# Patient Record
Sex: Male | Born: 1995 | Race: White | Hispanic: No | Marital: Single | Smoking: Never smoker
Health system: Southern US, Community
[De-identification: ages and names within clinical notes are randomized; demographics above are authoritative.]

## PROBLEM LIST (undated history)

## (undated) DIAGNOSIS — Z8719 Personal history of other diseases of the digestive system: Secondary | ICD-10-CM

## (undated) DIAGNOSIS — K551 Chronic vascular disorders of intestine: Secondary | ICD-10-CM

## (undated) HISTORY — PX: NO PAST SURGERIES: SHX2092

---
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
## 2014-05-16 IMAGING — CR DG ABDOMEN ACUTE W/ 1V CHEST
3 series · 3 of 3 positions shown · non-contrast
Comparison: Abdominal radiograph 11/21/2011, chest radiograph
11/20/2011

CLINICAL DATA: Nausea, vomiting, history gastric outlet obstruction

ACUTE ABDOMEN SERIES (ABDOMEN 2 VIEW & CHEST 1 VIEW)

[w chest pa]
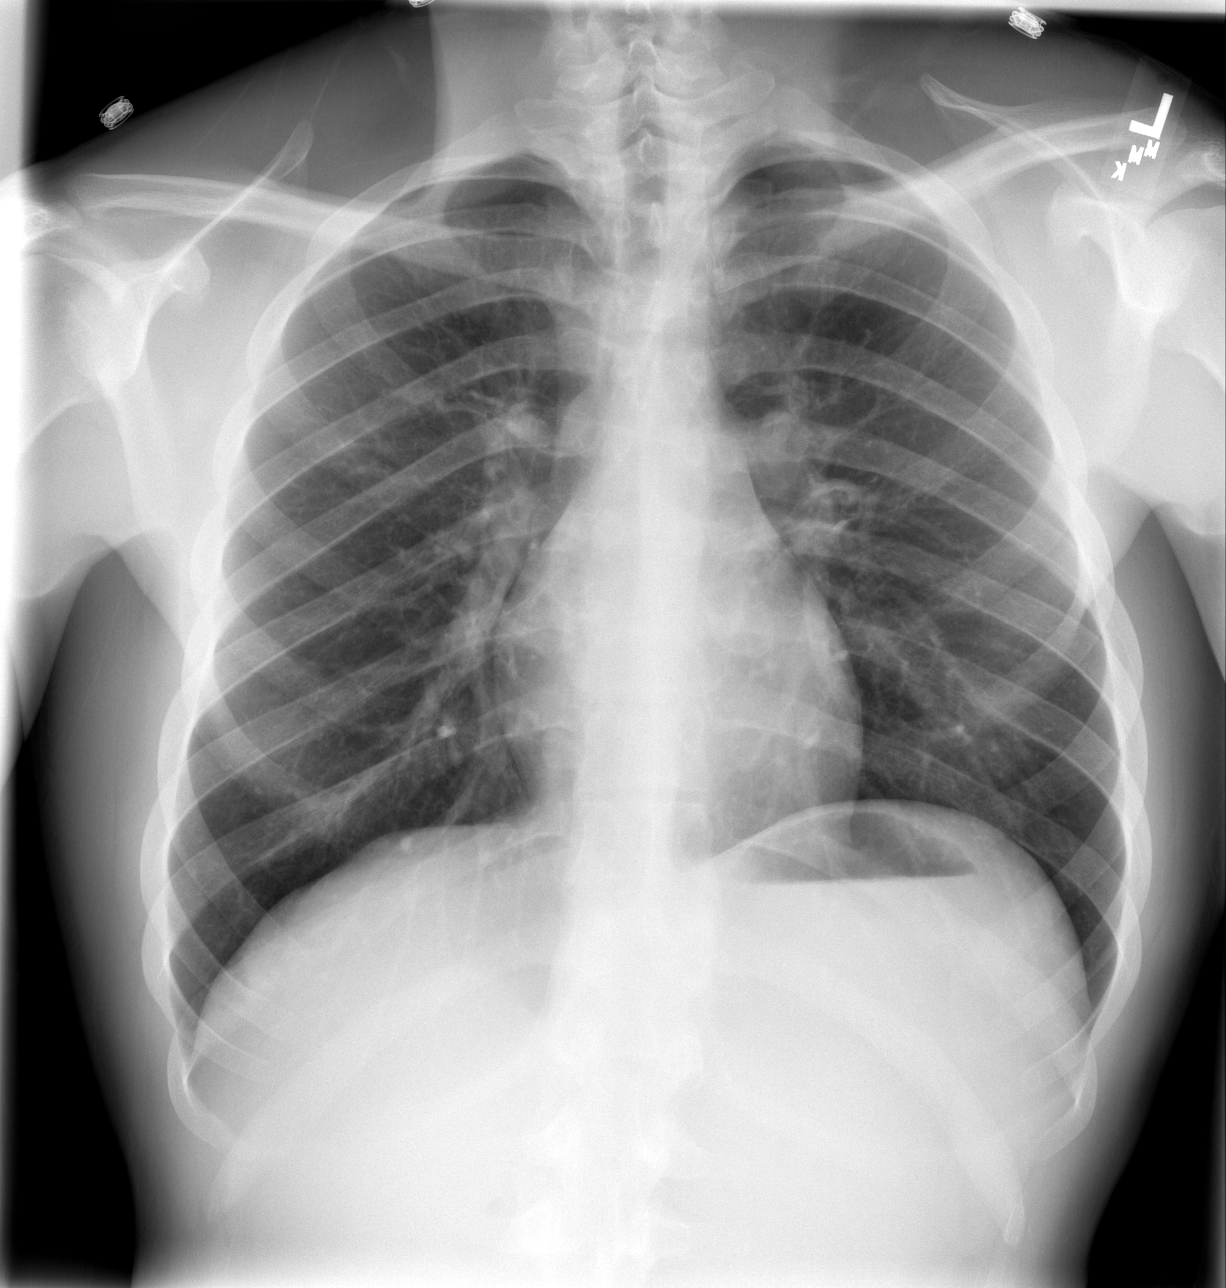

[w abdomen upright]
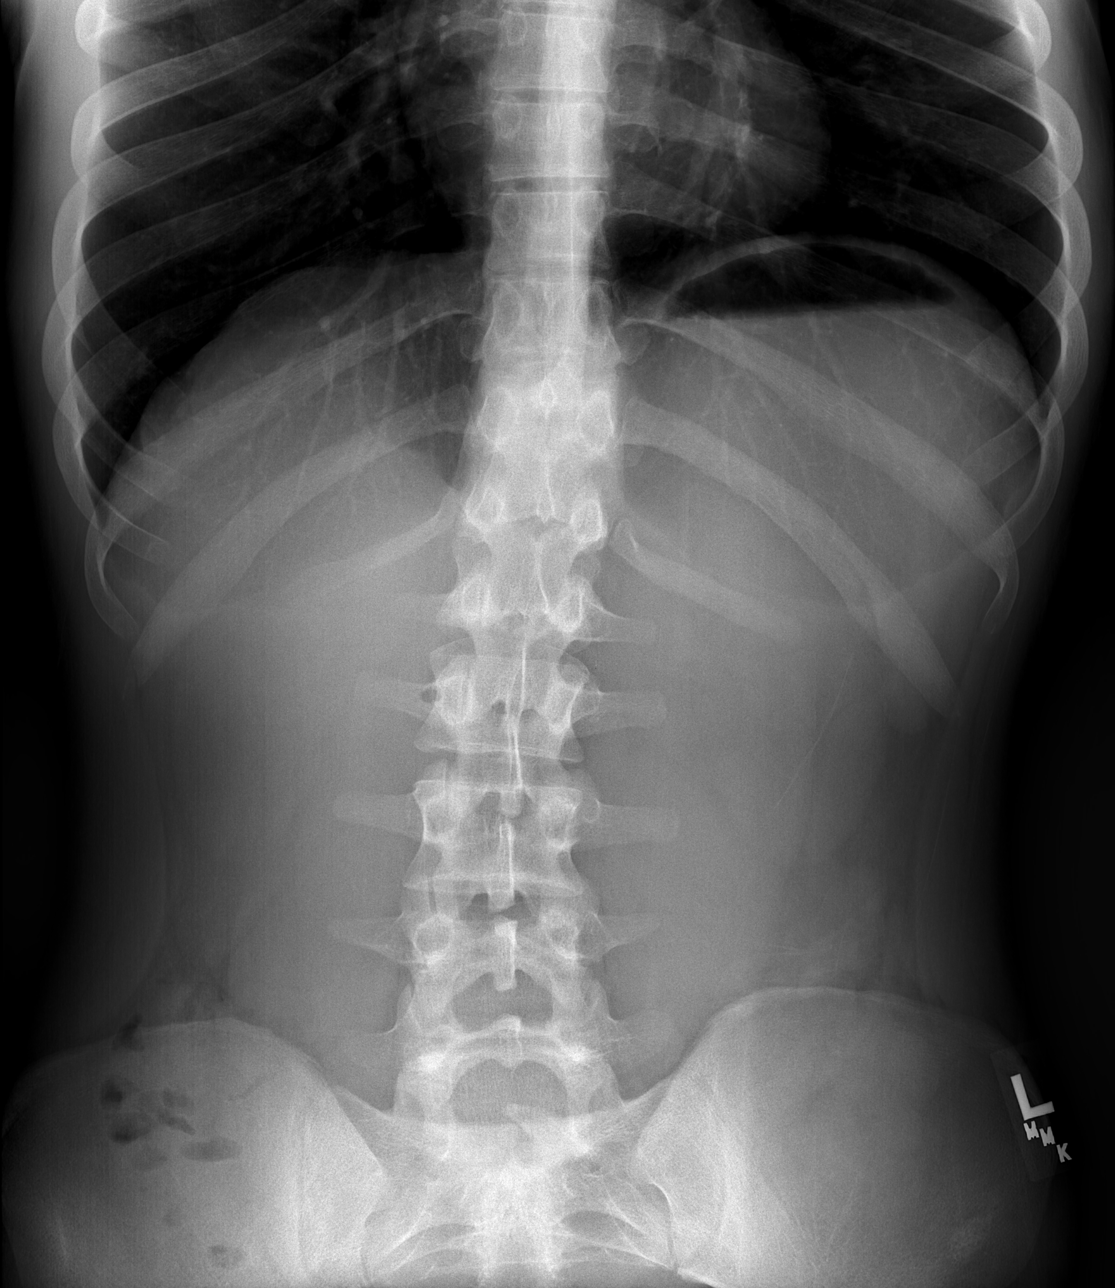

[t abdomen supine]
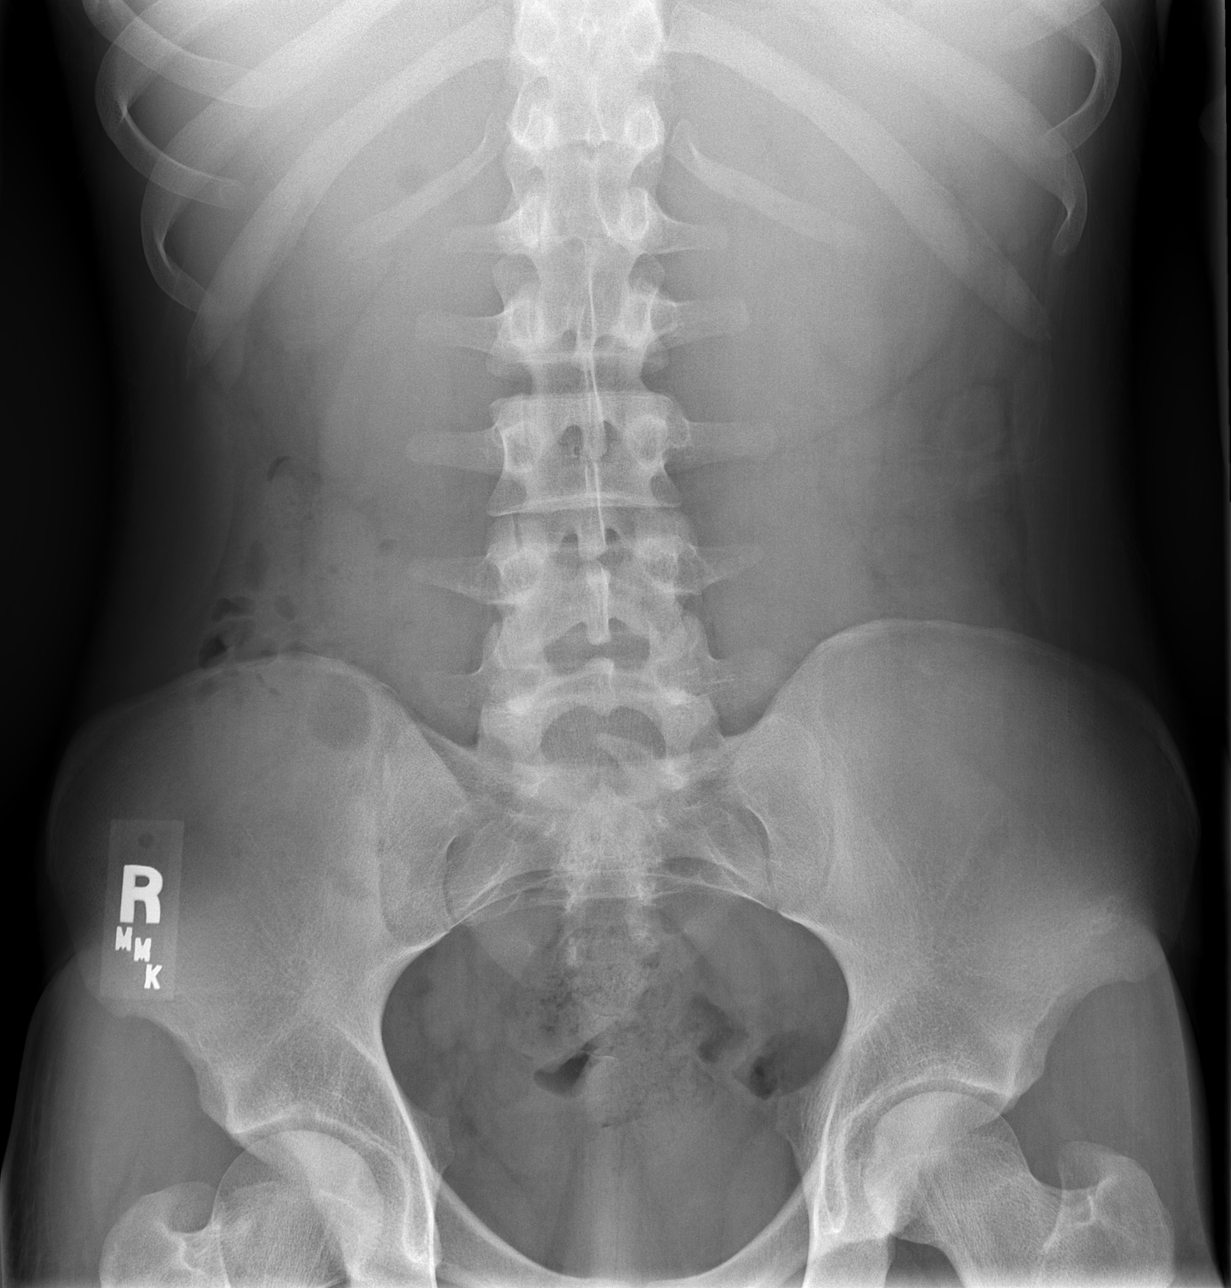

[3 of 3 positions shown; findings below may reference images not displayed]

FINDINGS: Normal heart size, mediastinal contours, and pulmonary vascularity.
Lungs clear.
Paucity of bowel gas.
No bowel dilatation or free intraperitoneal air identified.
Bones unremarkable.
Stomach appears questionably distended.
No definite urinary tract calcification.
IMPRESSION: Question mild gastric distention.

## 2017-06-11 ENCOUNTER — Emergency Department (HOSPITAL_BASED_OUTPATIENT_CLINIC_OR_DEPARTMENT_OTHER)
Admission: EM | Admit: 2017-06-11 | Discharge: 2017-06-11 | Disposition: A | Discharge status: Home / Self Care | Payer: Worker's Compensation | Attending: Emergency Medicine | Admitting: Emergency Medicine

## 2017-06-11 ENCOUNTER — Encounter (HOSPITAL_BASED_OUTPATIENT_CLINIC_OR_DEPARTMENT_OTHER): Payer: Self-pay | Admitting: *Deleted

## 2017-06-11 ENCOUNTER — Other Ambulatory Visit: Payer: Self-pay

## 2017-06-11 DIAGNOSIS — Y9389 Activity, other specified: Secondary | ICD-10-CM | POA: Insufficient documentation

## 2017-06-11 DIAGNOSIS — Y990 Civilian activity done for income or pay: Secondary | ICD-10-CM | POA: Diagnosis not present

## 2017-06-11 DIAGNOSIS — Z5321 Procedure and treatment not carried out due to patient leaving prior to being seen by health care provider: Secondary | ICD-10-CM | POA: Diagnosis not present

## 2017-06-11 DIAGNOSIS — Y929 Unspecified place or not applicable: Secondary | ICD-10-CM | POA: Insufficient documentation

## 2017-06-11 DIAGNOSIS — S00211A Abrasion of right eyelid and periocular area, initial encounter: Secondary | ICD-10-CM | POA: Diagnosis present

## 2017-06-11 DIAGNOSIS — W208XXA Other cause of strike by thrown, projected or falling object, initial encounter: Secondary | ICD-10-CM | POA: Diagnosis not present

## 2017-06-11 NOTE — ED Notes (Signed)
Pt informed registration he was leaving.  
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
## 2018-07-15 ENCOUNTER — Emergency Department (HOSPITAL_COMMUNITY): Payer: Managed Care, Other (non HMO)

## 2018-07-15 ENCOUNTER — Emergency Department (HOSPITAL_COMMUNITY)
Admission: EM | Admit: 2018-07-15 | Discharge: 2018-07-15 | Disposition: A | Discharge status: Home / Self Care | Payer: Managed Care, Other (non HMO) | Attending: Emergency Medicine | Admitting: Emergency Medicine

## 2018-07-15 ENCOUNTER — Other Ambulatory Visit: Payer: Self-pay

## 2018-07-15 DIAGNOSIS — M25511 Pain in right shoulder: Secondary | ICD-10-CM | POA: Insufficient documentation

## 2018-07-15 DIAGNOSIS — Y998 Other external cause status: Secondary | ICD-10-CM | POA: Diagnosis not present

## 2018-07-15 DIAGNOSIS — Y9389 Activity, other specified: Secondary | ICD-10-CM | POA: Diagnosis not present

## 2018-07-15 DIAGNOSIS — S39012A Strain of muscle, fascia and tendon of lower back, initial encounter: Secondary | ICD-10-CM | POA: Insufficient documentation

## 2018-07-15 DIAGNOSIS — Y92410 Unspecified street and highway as the place of occurrence of the external cause: Secondary | ICD-10-CM | POA: Diagnosis not present

## 2018-07-15 DIAGNOSIS — S3992XA Unspecified injury of lower back, initial encounter: Secondary | ICD-10-CM | POA: Diagnosis present

## 2018-07-15 MED ORDER — METHOCARBAMOL 500 MG PO TABS
500.0000 mg | ORAL_TABLET | Freq: Two times a day (BID) | ORAL | 0 refills | Status: AC | PRN
Start: 2018-07-15 — End: ?

## 2018-07-15 MED ORDER — NAPROXEN 500 MG PO TABS: 500.0000 mg | ORAL_TABLET | Freq: Two times a day (BID) | ORAL | 0 refills | Status: AC | PRN

## 2018-07-15 MED ORDER — NAPROXEN 500 MG PO TABS
500.0000 mg | ORAL_TABLET | Freq: Once | ORAL | Status: AC
  Administered 2018-07-15: 500 mg via ORAL
  Filled 2018-07-15: qty 1

## 2018-07-15 NOTE — Discharge Instructions (Addendum)
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
## 2018-07-15 NOTE — ED Notes (Signed)
Bed: [facility] Expected date:  Expected time:  Means of arrival:  Comments: 
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
## 2018-07-17 ENCOUNTER — Ambulatory Visit (HOSPITAL_COMMUNITY)
Admission: RE | Admit: 2018-07-17 | Discharge: 2018-07-17 | Disposition: A | Discharge status: Home / Self Care | Payer: Managed Care, Other (non HMO) | Source: Ambulatory Visit | Attending: Chiropractic Medicine | Admitting: Chiropractic Medicine

## 2018-07-17 ENCOUNTER — Other Ambulatory Visit (HOSPITAL_COMMUNITY): Payer: Self-pay | Admitting: Chiropractic Medicine

## 2018-07-17 DIAGNOSIS — M545 Low back pain, unspecified: Secondary | ICD-10-CM

## 2019-07-06 ENCOUNTER — Ambulatory Visit: Payer: Managed Care, Other (non HMO) | Attending: Internal Medicine

## 2019-07-06 DIAGNOSIS — Z20822 Contact with and (suspected) exposure to covid-19: Secondary | ICD-10-CM

## 2019-07-07 LAB — NOVEL CORONAVIRUS, NAA: SARS-CoV-2, NAA: NOT DETECTED

## 2019-07-23 ENCOUNTER — Ambulatory Visit: Payer: Managed Care, Other (non HMO) | Attending: Internal Medicine

## 2019-07-23 DIAGNOSIS — Z23 Encounter for immunization: Secondary | ICD-10-CM

## 2019-07-23 NOTE — Progress Notes (Signed)
   Covid-19 Vaccination Clinic  Name:  JENNER ROSIER    MRN: [id number] DOB: April 09, 1996  07/23/2019  Mr. Sakuma was observed post Covid-19 immunization for 15 minutes without incident. He was provided with Vaccine Information Sheet and instruction to access the V-Safe system.   Mr. Bonnette was instructed to call 911 with any severe reactions post vaccine: Marland Kitchen Difficulty breathing  . Swelling of face and throat  . A fast heartbeat  . A bad rash all over body  . Dizziness and weakness   Immunizations Administered    Name Date Dose VIS Date Route   Pfizer COVID-19 Vaccine 07/23/2019  2:02 PM 0.3 mL 04/24/2019 Intramuscular   Manufacturer: [employer], [employer]   Lot: [id number]   NDC: [address]-5

## 2019-07-24 ENCOUNTER — Ambulatory Visit: Payer: Managed Care, Other (non HMO)

## 2019-08-17 ENCOUNTER — Ambulatory Visit: Payer: Managed Care, Other (non HMO) | Attending: Internal Medicine

## 2019-08-17 DIAGNOSIS — Z23 Encounter for immunization: Secondary | ICD-10-CM

## 2019-08-17 NOTE — Progress Notes (Signed)
   Covid-19 Vaccination Clinic  Name:  IRVAN TIEDT    MRN: [id number] DOB: 22-Jul-1995  08/17/2019  Mr. Iovino was observed post Covid-19 immunization for 15 minutes without incident. He was provided with Vaccine Information Sheet and instruction to access the V-Safe system.   Mr. Chittum was instructed to call 911 with any severe reactions post vaccine: Marland Kitchen Difficulty breathing  . Swelling of face and throat  . A fast heartbeat  . A bad rash all over body  . Dizziness and weakness   Immunizations Administered    Name Date Dose VIS Date Route   Pfizer COVID-19 Vaccine 08/17/2019 10:18 AM 0.3 mL 04/24/2019 Intramuscular   Manufacturer: [employer], [employer]   Lot: [id number]   NDC: [address]-9

## 2019-08-26 ENCOUNTER — Other Ambulatory Visit: Payer: Self-pay

## 2019-08-26 ENCOUNTER — Emergency Department (HOSPITAL_COMMUNITY)
Admission: EM | Admit: 2019-08-26 | Discharge: 2019-08-26 | Discharge status: Against Medical Advice | Payer: Managed Care, Other (non HMO)
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
## 2024-03-09 ENCOUNTER — Other Ambulatory Visit: Payer: Self-pay | Admitting: Family Medicine

## 2024-03-09 DIAGNOSIS — R1031 Right lower quadrant pain: Secondary | ICD-10-CM

## 2024-03-11 ENCOUNTER — Inpatient Hospital Stay
Admission: RE | Admit: 2024-03-11 | Discharge: 2024-03-11 | Disposition: A | Source: Ambulatory Visit | Attending: Family Medicine

## 2024-03-11 DIAGNOSIS — R1031 Right lower quadrant pain: Secondary | ICD-10-CM

## 2024-03-11 MED ORDER — IOPAMIDOL (ISOVUE-370) INJECTION 76%
80.0000 mL | Freq: Once | INTRAVENOUS | Status: AC | PRN
  Administered 2024-03-11: 80 mL via INTRAVENOUS
